# Patient Record
Sex: Male | Born: 2000 | Race: White | Hispanic: No | Marital: Single | Smoking: Never smoker
Health system: Southern US, Community
[De-identification: ages and names within clinical notes are randomized; demographics above are authoritative.]

## PROBLEM LIST (undated history)

## (undated) DIAGNOSIS — E079 Disorder of thyroid, unspecified: Secondary | ICD-10-CM

---
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
## 2019-09-13 ENCOUNTER — Other Ambulatory Visit: Payer: Self-pay

## 2019-09-13 DIAGNOSIS — Z20822 Contact with and (suspected) exposure to covid-19: Secondary | ICD-10-CM

## 2019-09-15 LAB — NOVEL CORONAVIRUS, NAA: SARS-CoV-2, NAA: NOT DETECTED

## 2021-04-22 DIAGNOSIS — E039 Hypothyroidism, unspecified: Secondary | ICD-10-CM | POA: Diagnosis not present

## 2021-05-14 DIAGNOSIS — E039 Hypothyroidism, unspecified: Secondary | ICD-10-CM | POA: Diagnosis not present

## 2021-07-15 DIAGNOSIS — R7989 Other specified abnormal findings of blood chemistry: Secondary | ICD-10-CM | POA: Diagnosis not present

## 2021-07-15 DIAGNOSIS — R946 Abnormal results of thyroid function studies: Secondary | ICD-10-CM | POA: Diagnosis not present

## 2021-09-26 DIAGNOSIS — E039 Hypothyroidism, unspecified: Secondary | ICD-10-CM | POA: Diagnosis not present

## 2021-12-01 ENCOUNTER — Encounter: Payer: Self-pay | Admitting: Emergency Medicine

## 2021-12-01 ENCOUNTER — Ambulatory Visit
Admission: EM | Admit: 2021-12-01 | Discharge: 2021-12-01 | Disposition: A | Discharge status: Home / Self Care | Payer: BC Managed Care – PPO

## 2021-12-01 ENCOUNTER — Ambulatory Visit (INDEPENDENT_AMBULATORY_CARE_PROVIDER_SITE_OTHER): Payer: BC Managed Care – PPO

## 2021-12-01 ENCOUNTER — Other Ambulatory Visit: Payer: Self-pay

## 2021-12-01 DIAGNOSIS — M7989 Other specified soft tissue disorders: Secondary | ICD-10-CM | POA: Diagnosis not present

## 2021-12-01 DIAGNOSIS — L03113 Cellulitis of right upper limb: Secondary | ICD-10-CM | POA: Diagnosis not present

## 2021-12-01 DIAGNOSIS — M79641 Pain in right hand: Secondary | ICD-10-CM

## 2021-12-01 HISTORY — DX: Disorder of thyroid, unspecified: E07.9

## 2021-12-01 MED ORDER — DOXYCYCLINE HYCLATE 100 MG PO CAPS: 100.0000 mg | ORAL_CAPSULE | Freq: Two times a day (BID) | ORAL | 0 refills | Status: DC

## 2021-12-01 MED ORDER — PREDNISONE 20 MG PO TABS: 40.0000 mg | ORAL_TABLET | Freq: Every day | ORAL | 0 refills | Status: DC

## 2021-12-01 MED ORDER — CEFTRIAXONE SODIUM 1 G IJ SOLR
1.0000 g | Freq: Once | INTRAMUSCULAR | Status: AC
  Administered 2021-12-01: 1 g via INTRAMUSCULAR

## 2021-12-01 NOTE — Discharge Instructions (Addendum)
Return to either urgent care in [place] or here in [place] just for recheck of your hand in 3 days.  As discussed if any of your symptoms worsen or if the redness in your hand starts to expand beyond the location that the redness is present now I would like for you to immediately to the emergency department as this indication that your infection is deeper within the hand and will require IV antibiotics.

## 2021-12-01 NOTE — ED Triage Notes (Signed)
Pt states he poked his right hand on a wore fence 5 days ago. He know has swelling and pain. Pt believes his last tetanus was 2 years ago.

## 2021-12-01 NOTE — ED Provider Notes (Signed)
David Hardy    CSN: [id number] Arrival date & time: 12/01/21  1114      History   Chief Complaint Chief Complaint  Patient presents with   Hand Pain    HPI David Hardy is a 21 y.o. male.   HPI Patient presents today with right hand swelling, redness, and pain following an injury in which he punctured his hand with a fence 5 days ago. He reports 4 days ago noticing mild redness, however swelling and redness progressively worsened over the last 24 hours. Patient is afebrile. Endorses that he is up to date with his tetanus  (rec'd within last 2 years).  Past Medical History:  Diagnosis Date   Thyroid disease     There are no problems to display for this patient.   History reviewed. No pertinent surgical history.     Home Medications    Prior to Admission medications   Medication Sig Start Date End Date Taking? Authorizing Provider  doxycycline (VIBRAMYCIN) 100 MG capsule Take 1 capsule (100 mg total) by mouth 2 (two) times daily. 12/01/21  Yes Bing Neighbors, FNP  predniSONE (DELTASONE) 20 MG tablet Take 2 tablets (40 mg total) by mouth daily with breakfast. 12/01/21  Yes Bing Neighbors, FNP  levothyroxine (SYNTHROID) 150 MCG tablet Take 150 mcg by mouth every morning. 08/15/21   [provider]    Family History History reviewed. No pertinent family history.  Social History Social History   Tobacco Use   Smoking status: Never   Smokeless tobacco: Never  Vaping Use   Vaping Use: Never used  Substance Use Topics   Alcohol use: Never   Drug use: Never     Allergies   Patient has no known allergies.   Review of Systems Review of Systems Pertinent negatives listed in HPI  Physical Exam Triage Vital Signs ED Triage Vitals  Enc Vitals Group     BP 12/01/21 1156 (!) 144/94     Pulse Rate 12/01/21 1156 86     Resp 12/01/21 1156 18     Temp 12/01/21 1156 98.6 F (37 C)     Temp Source 12/01/21 1156 Oral     SpO2 12/01/21  1156 98 %     Weight --      Height --      Head Circumference --      Peak Flow --      Pain Score 12/01/21 1155 8     Pain Loc --      Pain Edu? --      Excl. in GC? --    No data found.  Updated Vital Signs BP (!) 144/94 (BP Location: Left Arm)    Pulse 86    Temp 98.6 F (37 C) (Oral)    Resp 18    SpO2 98%   Visual Acuity Right Eye Distance:   Left Eye Distance:   Bilateral Distance:    Right Eye Near:   Left Eye Near:    Bilateral Near:     Physical Exam Constitutional:      Appearance: Normal appearance. He is not ill-appearing, toxic-appearing or diaphoretic.  HENT:     Head: Normocephalic and atraumatic.  Eyes:     Extraocular Movements: Extraocular movements intact.     Pupils: Pupils are equal, round, and reactive to light.  Cardiovascular:     Rate and Rhythm: Normal rate and regular rhythm.  Pulmonary:     Effort: Pulmonary effort is  normal.     Breath sounds: Normal breath sounds.  Skin:    Capillary Refill: Capillary refill takes less than 2 seconds.     Comments: See photos  Neurological:     General: No focal deficit present.     Mental Status: He is alert and oriented to person, place, and time.  Psychiatric:        Mood and Affect: Mood normal.        Behavior: Behavior normal.        Thought Content: Thought content normal.        Judgment: Judgment normal.           UC Treatments / Results  Labs (all labs ordered are listed, but only abnormal results are displayed) Labs Reviewed - No data to display  EKG   Radiology DG Hand Complete Right  Result Date: 12/01/2021 CLINICAL DATA:  Trauma pain and swelling EXAM: RIGHT HAND - COMPLETE 3+ VIEW COMPARISON:  None. FINDINGS: No fracture or dislocation is seen. No focal lytic lesions are seen. There is soft tissue swelling over the dorsum. There are no opaque foreign bodies. IMPRESSION: No fracture or dislocation is seen. There are no opaque foreign bodies. If there is clinical suspicion  for osteomyelitis, follow-up MRI may be considered. Electronically Signed   By: Ernie Avena M.D.   On: 12/01/2021 12:36    Procedures Procedures (including critical care time)  Medications Ordered in UC Medications  cefTRIAXone (ROCEPHIN) injection 1 g (has no administration in time range)    Initial Impression / Assessment and Plan / UC Course  I have reviewed the triage vital signs and the nursing notes.  Pertinent labs & imaging results that were available during my care of the patient were reviewed by me and considered in my medical decision making (see chart for details).    Cellulitis of Right Hand Rocephin 1 gm IM given in clinic Continue antibiotic treatment with doxycyline 100 mg BID Prednisone 40 mg daily x 5 days.  RTC in 3 days for recheck. Discussed at length indications in which patient should seek care in the ER and that in some circumstances these types of infection require IV antibiotics.  Final Clinical Impressions(s) / UC Diagnoses   Final diagnoses:  Cellulitis of hand, right     Discharge Instructions      Return to either urgent care in [place] or here in [place] just for recheck of your hand in 3 days.  As discussed if any of your symptoms worsen or if the redness in your hand starts to expand beyond the location that the redness is present now I would like for you to immediately to the emergency department as this indication that your infection is deeper within the hand and will require IV antibiotics.   ED Prescriptions     Medication Sig Dispense Auth. Provider   doxycycline (VIBRAMYCIN) 100 MG capsule Take 1 capsule (100 mg total) by mouth 2 (two) times daily. 20 capsule Bing Neighbors, FNP   predniSONE (DELTASONE) 20 MG tablet Take 2 tablets (40 mg total) by mouth daily with breakfast. 10 tablet Bing Neighbors, FNP      PDMP not reviewed this encounter.   Bing Neighbors, [place] 12/02/21 [phone]

## 2021-12-02 ENCOUNTER — Encounter (HOSPITAL_BASED_OUTPATIENT_CLINIC_OR_DEPARTMENT_OTHER)
Admission: RE | Disposition: A | Discharge status: Home / Self Care | Payer: Self-pay | Source: Ambulatory Visit | Attending: Orthopedic Surgery

## 2021-12-02 ENCOUNTER — Other Ambulatory Visit: Payer: Self-pay

## 2021-12-02 ENCOUNTER — Other Ambulatory Visit: Payer: Self-pay | Admitting: Orthopedic Surgery

## 2021-12-02 ENCOUNTER — Ambulatory Visit (HOSPITAL_BASED_OUTPATIENT_CLINIC_OR_DEPARTMENT_OTHER)
Admission: RE | Admit: 2021-12-02 | Discharge: 2021-12-02 | Disposition: A | Discharge status: Home / Self Care | Payer: BC Managed Care – PPO | Source: Ambulatory Visit | Attending: Orthopedic Surgery | Admitting: Orthopedic Surgery

## 2021-12-02 ENCOUNTER — Encounter (HOSPITAL_BASED_OUTPATIENT_CLINIC_OR_DEPARTMENT_OTHER): Payer: Self-pay | Admitting: Orthopedic Surgery

## 2021-12-02 ENCOUNTER — Ambulatory Visit (HOSPITAL_BASED_OUTPATIENT_CLINIC_OR_DEPARTMENT_OTHER): Payer: BC Managed Care – PPO | Admitting: Anesthesiology

## 2021-12-02 DIAGNOSIS — S61234A Puncture wound without foreign body of right ring finger without damage to nail, initial encounter: Secondary | ICD-10-CM | POA: Diagnosis not present

## 2021-12-02 DIAGNOSIS — L02511 Cutaneous abscess of right hand: Secondary | ICD-10-CM | POA: Insufficient documentation

## 2021-12-02 DIAGNOSIS — X58XXXA Exposure to other specified factors, initial encounter: Secondary | ICD-10-CM | POA: Insufficient documentation

## 2021-12-02 DIAGNOSIS — M65141 Other infective (teno)synovitis, right hand: Secondary | ICD-10-CM | POA: Diagnosis not present

## 2021-12-02 DIAGNOSIS — E039 Hypothyroidism, unspecified: Secondary | ICD-10-CM | POA: Insufficient documentation

## 2021-12-02 HISTORY — PX: INCISION AND DRAINAGE: SHX5863

## 2021-12-02 SURGERY — INCISION AND DRAINAGE
Anesthesia: Monitor Anesthesia Care | Site: Hand | Laterality: Right

## 2021-12-02 MED ORDER — FENTANYL CITRATE (PF) 100 MCG/2ML IJ SOLN
INTRAMUSCULAR | Status: AC
  Filled 2021-12-02: qty 2

## 2021-12-02 MED ORDER — LACTATED RINGERS IV SOLN: INTRAVENOUS | Status: DC | PRN

## 2021-12-02 MED ORDER — PROPOFOL 500 MG/50ML IV EMUL
INTRAVENOUS | Status: DC | PRN
  Administered 2021-12-02: 75 ug/kg/min via INTRAVENOUS

## 2021-12-02 MED ORDER — HYDROCODONE-ACETAMINOPHEN 5-325 MG PO TABS: ORAL_TABLET | ORAL | 0 refills | Status: DC

## 2021-12-02 MED ORDER — FENTANYL CITRATE (PF) 100 MCG/2ML IJ SOLN
INTRAMUSCULAR | Status: DC | PRN
Start: 2021-12-02 — End: 2021-12-02
  Administered 2021-12-02: 50 ug via INTRAVENOUS

## 2021-12-02 MED ORDER — PROPOFOL 500 MG/50ML IV EMUL
INTRAVENOUS | Status: AC
  Filled 2021-12-02: qty 50

## 2021-12-02 MED ORDER — MIDAZOLAM HCL 2 MG/2ML IJ SOLN
INTRAMUSCULAR | Status: AC
  Filled 2021-12-02: qty 2

## 2021-12-02 MED ORDER — ONDANSETRON HCL 4 MG/2ML IJ SOLN
INTRAMUSCULAR | Status: DC | PRN
Start: 2021-12-02 — End: 2021-12-02
  Administered 2021-12-02: 4 mg via INTRAVENOUS

## 2021-12-02 MED ORDER — 0.9 % SODIUM CHLORIDE (POUR BTL) OPTIME
TOPICAL | Status: DC | PRN
Start: 2021-12-02 — End: 2021-12-02
  Administered 2021-12-02: 200 mL

## 2021-12-02 MED ORDER — BUPIVACAINE-EPINEPHRINE (PF) 0.5% -1:200000 IJ SOLN
INTRAMUSCULAR | Status: DC | PRN
  Administered 2021-12-02: 30 mL via PERINEURAL

## 2021-12-02 MED ORDER — FENTANYL CITRATE (PF) 100 MCG/2ML IJ SOLN
100.0000 ug | Freq: Once | INTRAMUSCULAR | Status: AC
  Administered 2021-12-02: 100 ug via INTRAVENOUS

## 2021-12-02 MED ORDER — MIDAZOLAM HCL 2 MG/2ML IJ SOLN
2.0000 mg | Freq: Once | INTRAMUSCULAR | Status: AC
  Administered 2021-12-02: 2 mg via INTRAVENOUS

## 2021-12-02 MED ORDER — PROPOFOL 10 MG/ML IV BOLUS
INTRAVENOUS | Status: DC | PRN
  Administered 2021-12-02: 200 mg via INTRAVENOUS

## 2021-12-02 MED ORDER — CEFAZOLIN SODIUM-DEXTROSE 2-3 GM-%(50ML) IV SOLR
INTRAVENOUS | Status: DC | PRN
  Administered 2021-12-02: 2 g via INTRAVENOUS

## 2021-12-02 MED ORDER — DEXAMETHASONE SODIUM PHOSPHATE 10 MG/ML IJ SOLN
INTRAMUSCULAR | Status: DC | PRN
  Administered 2021-12-02: 5 mg

## 2021-12-02 SURGICAL SUPPLY — 52 items
APL PRP STRL LF DISP 70% ISPRP (MISCELLANEOUS) ×1
BAG DECANTER FOR FLEXI CONT (MISCELLANEOUS) IMPLANT
BLADE MINI RND TIP GREEN BEAV (BLADE) IMPLANT
BLADE SURG 15 STRL LF DISP TIS (BLADE) ×2 IMPLANT
BLADE SURG 15 STRL SS (BLADE) ×4
BNDG CMPR 9X4 STRL LF SNTH (GAUZE/BANDAGES/DRESSINGS) ×1
BNDG COHESIVE 1X5 TAN STRL LF (GAUZE/BANDAGES/DRESSINGS) IMPLANT
BNDG ELASTIC 2X5.8 VLCR STR LF (GAUZE/BANDAGES/DRESSINGS) IMPLANT
BNDG ELASTIC 3X5.8 VLCR STR LF (GAUZE/BANDAGES/DRESSINGS) ×1 IMPLANT
BNDG ESMARK 4X9 LF (GAUZE/BANDAGES/DRESSINGS) ×1 IMPLANT
BNDG GAUZE 1X2.1 STRL (MISCELLANEOUS) IMPLANT
BNDG GAUZE ELAST 4 BULKY (GAUZE/BANDAGES/DRESSINGS) ×1 IMPLANT
CHLORAPREP W/TINT 26 (MISCELLANEOUS) ×2 IMPLANT
CORD BIPOLAR FORCEPS 12FT (ELECTRODE) ×2 IMPLANT
COVER BACK TABLE 60X90IN (DRAPES) ×2 IMPLANT
COVER MAYO STAND STRL (DRAPES) ×2 IMPLANT
CUFF TOURN SGL QUICK 18X4 (TOURNIQUET CUFF) ×2 IMPLANT
DRAPE EXTREMITY T 121X128X90 (DISPOSABLE) ×2 IMPLANT
DRAPE SURG 17X23 STRL (DRAPES) ×1 IMPLANT
GAUZE PACKING IODOFORM 1/4X15 (PACKING) ×1 IMPLANT
GAUZE SPONGE 4X4 12PLY STRL (GAUZE/BANDAGES/DRESSINGS) ×2 IMPLANT
GAUZE XEROFORM 1X8 LF (GAUZE/BANDAGES/DRESSINGS) ×2 IMPLANT
GLOVE SRG 8 PF TXTR STRL LF DI (GLOVE) ×1 IMPLANT
GLOVE SURG ENC MOIS LTX SZ7.5 (GLOVE) ×2 IMPLANT
GLOVE SURG UNDER POLY LF SZ8 (GLOVE) ×2
GOWN STRL REUS W/ TWL LRG LVL3 (GOWN DISPOSABLE) ×1 IMPLANT
GOWN STRL REUS W/TWL LRG LVL3 (GOWN DISPOSABLE) ×2
GOWN STRL REUS W/TWL XL LVL3 (GOWN DISPOSABLE) ×2 IMPLANT
LOOP VESSEL MAXI BLUE (MISCELLANEOUS) IMPLANT
NDL BLUNT 17GA (NEEDLE) IMPLANT
NDL HYPO 25X1 1.5 SAFETY (NEEDLE) IMPLANT
NEEDLE BLUNT 17GA (NEEDLE) IMPLANT
NEEDLE HYPO 25X1 1.5 SAFETY (NEEDLE) IMPLANT
NS IRRIG 1000ML POUR BTL (IV SOLUTION) ×2 IMPLANT
PACK BASIN DAY SURGERY FS (CUSTOM PROCEDURE TRAY) ×2 IMPLANT
PAD CAST 3X4 CTTN HI CHSV (CAST SUPPLIES) IMPLANT
PADDING CAST ABS 4INX4YD NS (CAST SUPPLIES) ×1
PADDING CAST ABS COTTON 4X4 ST (CAST SUPPLIES) ×1 IMPLANT
PADDING CAST COTTON 3X4 STRL (CAST SUPPLIES)
SPLINT PLASTER CAST XFAST 3X15 (CAST SUPPLIES) IMPLANT
SPLINT PLASTER XTRA FASTSET 3X (CAST SUPPLIES) ×10
STOCKINETTE 4X48 STRL (DRAPES) ×2 IMPLANT
SUT ETHILON 4 0 PS 2 18 (SUTURE) IMPLANT
SWAB COLLECTION DEVICE MRSA (MISCELLANEOUS) ×1 IMPLANT
SWAB CULTURE ESWAB REG 1ML (MISCELLANEOUS) ×1 IMPLANT
SYR 20ML LL LF (SYRINGE) IMPLANT
SYR BULB EAR ULCER 3OZ GRN STR (SYRINGE) ×2 IMPLANT
SYR CONTROL 10ML LL (SYRINGE) IMPLANT
SYR TOOMEY 50ML (SYRINGE) IMPLANT
TOWEL GREEN STERILE FF (TOWEL DISPOSABLE) ×4 IMPLANT
TUBE FEEDING ENTERAL 5FR 16IN (TUBING) IMPLANT
UNDERPAD 30X36 HEAVY ABSORB (UNDERPADS AND DIAPERS) ×2 IMPLANT

## 2021-12-02 NOTE — Anesthesia Preprocedure Evaluation (Addendum)
Anesthesia Evaluation  Patient identified by MRN, date of birth, ID band Patient awake    Reviewed: Allergy & Precautions, NPO status , Patient's Chart, lab work & pertinent test results  History of Anesthesia Complications Negative for: history of anesthetic complications  Airway Mallampati: III  TM Distance: >3 FB Neck ROM: Full    Dental no notable dental hx. (+) Dental Advisory Given   Pulmonary neg pulmonary ROS,    Pulmonary exam normal        Cardiovascular negative cardio ROS   Rhythm:Regular Rate:Tachycardia     Neuro/Psych negative neurological ROS     GI/Hepatic negative GI ROS, Neg liver ROS,   Endo/Other  Hypothyroidism   Renal/GU negative Renal ROS     Musculoskeletal negative musculoskeletal ROS (+)   Abdominal   Peds  Hematology negative hematology ROS (+)   Anesthesia Other Findings   Reproductive/Obstetrics                            Anesthesia Physical Anesthesia Plan  ASA: 2  Anesthesia Plan: Regional and General   Post-op Pain Management: Celebrex PO (pre-op) and Tylenol PO (pre-op)   Induction:   PONV Risk Score and Plan: 2 and Ondansetron and Midazolam  Airway Management Planned: LMA  Additional Equipment:   Intra-op Plan:   Post-operative Plan: Extubation in OR  Informed Consent: I have reviewed the patients History and Physical, chart, labs and discussed the procedure including the risks, benefits and alternatives for the proposed anesthesia with the patient or authorized representative who has indicated his/her understanding and acceptance.     Dental advisory given  Plan Discussed with: CRNA and Anesthesiologist  Anesthesia Plan Comments:       Anesthesia Quick Evaluation

## 2021-12-02 NOTE — Anesthesia Procedure Notes (Signed)
Procedure Name: MAC Date/Time: 12/02/2021 3:34 PM Performed by: Signe Colt, CRNA Pre-anesthesia Checklist: Patient identified Patient Re-evaluated:Patient Re-evaluated prior to induction Oxygen Delivery Method: Simple face mask

## 2021-12-02 NOTE — Progress Notes (Signed)
Assisted Dr. Singer with right, ultrasound guided, supraclavicular block. Side rails up, monitors on throughout procedure. See vital signs in flow sheet. Tolerated Procedure well. 

## 2021-12-02 NOTE — H&P (Signed)
°  David Hardy is an 21 y.o. male.   Chief Complaint: Right ring finger infection HPI: 21 year old right-hand-dominant male states he sustained a puncture wound to the volar aspect of the right ring finger 5 days ago.  This has become progressively more swollen painful and erythematous.  No fevers chills or night sweats.  He was seen at urgent care and referred for further care.  He reports no previous injury to the finger.  Allergies: No Known Allergies  Past Medical History:  Diagnosis Date   Thyroid disease     History reviewed. No pertinent surgical history.  Family History: History reviewed. No pertinent family history.  Social History:   reports that he has never smoked. He has never used smokeless tobacco. He reports that he does not drink alcohol and does not use drugs.  Medications: Medications Prior to Admission  Medication Sig Dispense Refill   doxycycline (VIBRAMYCIN) 100 MG capsule Take 1 capsule (100 mg total) by mouth 2 (two) times daily. 20 capsule 0   levothyroxine (SYNTHROID) 150 MCG tablet Take 150 mcg by mouth every morning.     predniSONE (DELTASONE) 20 MG tablet Take 2 tablets (40 mg total) by mouth daily with breakfast. 10 tablet 0    No results found for this or any previous visit (from the past 48 hour(s)).  DG Hand Complete Right  Result Date: 12/01/2021 CLINICAL DATA:  Trauma pain and swelling EXAM: RIGHT HAND - COMPLETE 3+ VIEW COMPARISON:  None. FINDINGS: No fracture or dislocation is seen. No focal lytic lesions are seen. There is soft tissue swelling over the dorsum. There are no opaque foreign bodies. IMPRESSION: No fracture or dislocation is seen. There are no opaque foreign bodies. If there is clinical suspicion for osteomyelitis, follow-up MRI may be considered. Electronically Signed   By: Ernie Avena M.D.   On: 12/01/2021 12:36      Blood pressure (!) 157/95, pulse (!) 129, temperature 98.4 F (36.9 C), temperature source Oral, resp.  rate 16, height 5\' 7"  (1.702 m), weight 98.7 kg, SpO2 100 %.  General appearance: alert, cooperative, and appears stated age Head: Normocephalic, without obvious abnormality, atraumatic Neck: supple, symmetrical, trachea midline Extremities: Intact sensation and capillary refill all digits.  +epl/fpl/io.  Right ring finger with puncture wound at proximal finger flexion crease.  There is surrounding erythema and swelling.  He is tender to palpation in this area.  Nontender over the middle phalanx volarly.  Pain at the volar aspect of the MP joint with passive extension of the finger.  He is able to flex. Pulses: 2+ and symmetric Skin: Skin color, texture, turgor normal. No rashes or lesions Neurologic: Grossly normal Incision/Wound: As above  Assessment/Plan Right ring finger abscess possible flexor sheath infection.  Recommend incision and drainage in the operating room possibly including the flexor tendon sheath.  Risks, benefits and alternatives of surgery were discussed including risks of blood loss, infection, damage to nerves/vessels/tendons/ligament/bone, failure of surgery, need for additional surgery, complication with wound healing, stiffness, need for repeat irrigation and debridement.  He voiced understanding of these risks and elected to proceed.    12/02/2021, 3:04 PM

## 2021-12-02 NOTE — Op Note (Signed)
NAME: David Hardy [facility] MEDICAL RECORD NO: [id number] DATE OF BIRTH: [birth year] FACILITY: [facility] LOCATION: [facility] PHYSICIAN: Tami Ribas, MD   OPERATIVE REPORT   DATE OF PROCEDURE: 12/02/21    PREOPERATIVE DIAGNOSIS: Right hand and ring finger abscess with possible flexor sheath infection   POSTOPERATIVE DIAGNOSIS: Right hand and ring finger abscess   PROCEDURE: Vision drainage right hand and ring finger abscess   SURGEON:  Betha Loa, M.D.   ASSISTANT: none   ANESTHESIA:  General with regional   INTRAVENOUS FLUIDS:  Per anesthesia flow sheet.   ESTIMATED BLOOD LOSS:  Minimal.   COMPLICATIONS:  None.   SPECIMENS: Cultures to micro   TOURNIQUET TIME:    Total Tourniquet Time Documented: Upper Arm (Right) - 18 minutes Total: Upper Arm (Right) - 18 minutes    DISPOSITION:  Stable to PACU.   INDICATIONS: 21 year old male states he poked the base of his right ring finger on a fence post 5 days ago.  He has had progressively worsening swelling and erythema as well as pain in the area.  He was seen by his primary care physician this morning and referred for further care.  I recommend incision and drainage of the right hand and ring finger possibly include risks, benefits and alternatives of surgery were discussed including the risks of blood loss, infection, damage to nerves, vessels, tendons, ligaments, bone for surgery, need for additional surgery, complications with wound healing, continued pain, stiffness, , need for repeat irrigation and debridement.  He voiced understanding of these risks and elected to proceed.  OPERATIVE COURSE:  After being identified preoperatively by myself,  the patient and I agreed on the procedure and site of the procedure.  The surgical site was marked.  Surgical consent had been signed. He was given IV antibiotics as preoperative antibiotic prophylaxis. He was transferred to the operating room and placed on the operating table  in supine position with the Right upper extremity on an arm board.  A regional block had been performed by anesthesia in preoperative holding.    Right upper extremity was prepped and draped in normal sterile orthopedic fashion.  A surgical pause was performed between the surgeons, anesthesia, and operating room staff and all were in agreement as to the patient, procedure, and site of procedure.  Tourniquet at the proximal aspect of the extremity was inflated to 250 mmHg after exsanguination of the arm with an Esmarch bandage.  Incision was made at the volar aspect of the MP joint of the ring finger.  The abscess appeared to be right at the proximal finger flexion crease.  He was converted to general anesthesia at this point.  The subcutaneous tissues were spread with the scissors.  There is gross purulence.  Cultures were taken for aerobes and anaerobes.  The skin was removed over the abscess at the proximal flexion crease.  There was a tract going down to the abscess cavity.  The purulence was removed with the Ray-Tec sponge.  The flexor tendon sheath was visualized.  There was no violation of the sheath.  There did not appear to be fluid within the sheath and the sheath itself was not distended.  The wound was copiously irrigated with sterile saline.  The pickups were used to debride any devitalized fat and purulence.  A Ray-Tec sponge was also used to help remove purulence.  After copious irrigation with sterile saline the wound was packed with quarter inch iodoform gauze.  The wounds were then  dressed with sterile 4 x 4's and wrapped with a Kerlix bandage.  A volar splint was placed including the long ring and small fingers.  This was wrapped with Kerlix and Ace bandage.  The tourniquet was deflated at 18 minutes.  Fingertips were pink with brisk capillary refill after deflation of tourniquet.  The operative  drapes were broken down.  The patient was awoken from anesthesia safely.  He was transferred back to  the stretcher and taken to PACU in stable condition.  I will see him back in the office in 3-4 days for postoperative followup.  I will give him a prescription for Norco 5/325 1-2 tabs PO q6 hours prn pain, dispense # 20 and doxycycline 100 mg p.o. twice daily x7 days.   Betha Loa, MD Electronically signed, 12/02/21

## 2021-12-02 NOTE — Anesthesia Postprocedure Evaluation (Signed)
Anesthesia Post Note  Patient: David Hardy  Procedure(s) Performed: INCISION AND DRAINAGE RIGHT RING FINGER (Right: Hand)     Patient location during evaluation: PACU Anesthesia Type: Regional and General Level of consciousness: sedated Pain management: pain level controlled Vital Signs Assessment: post-procedure vital signs reviewed and stable Respiratory status: spontaneous breathing and respiratory function stable Cardiovascular status: stable Postop Assessment: no apparent nausea or vomiting Anesthetic complications: no   No notable events documented.  Last Vitals:  Vitals:   12/02/21 1630 12/02/21 1645  BP: (!) 117/93 128/90  Pulse: (!) 109 100  Resp: (!) 25 16  Temp:  36.8 C  SpO2: 92% 95%    Last Pain:  Vitals:   12/02/21 1645  TempSrc:   PainSc: 0-No pain                 [name] DANIEL

## 2021-12-02 NOTE — Transfer of Care (Signed)
Immediate Anesthesia Transfer of Care Note  Patient: Bill Yohn Foulkes  Procedure(s) Performed: INCISION AND DRAINAGE RIGHT RING FINGER (Right: Hand)  Patient Location: PACU  Anesthesia Type:GA combined with regional for post-op pain  Level of Consciousness: drowsy and patient cooperative  Airway & Oxygen Therapy: Patient Spontanous Breathing and Patient connected to face mask oxygen  Post-op Assessment: Report given to RN and Post -op Vital signs reviewed and stable  Post vital signs: Reviewed and stable  Last Vitals:  Vitals Value Taken Time  BP    Temp    Pulse 91 12/02/21 1602  Resp    SpO2 98 % 12/02/21 1602  Vitals shown include unvalidated device data.  Last Pain:  Vitals:   12/02/21 1449  TempSrc: Oral  PainSc: 7       Patients Stated Pain Goal: 3 (12/02/21 1449)  Complications: No notable events documented.

## 2021-12-02 NOTE — Anesthesia Procedure Notes (Signed)
Anesthesia Regional Block: Supraclavicular block   Pre-Anesthetic Checklist: , timeout performed,  Correct Patient, Correct Site, Correct Laterality,  Correct Procedure, Correct Position, site marked,  Risks and benefits discussed,  Surgical consent,  Pre-op evaluation,  At surgeon's request and post-op pain management  Laterality: Right  Prep: chloraprep       Needles:  Injection technique: Single-shot  Needle Type: Echogenic Stimulator Needle     Needle Length: 5cm  Needle Gauge: 22     Additional Needles:   Narrative:  Injection made incrementally with aspirations every 5 mL.  Performed by: Personally  Anesthesiologist: Heather Roberts, MD  Additional Notes: Functioning IV was confirmed and monitors applied.  A 80mm 22ga echogenic arrow stimulator was used. Sterile prep and drape,hand hygiene and sterile gloves were used.Ultrasound guidance: relevant anatomy identified, needle position confirmed, local anesthetic spread visualized around nerve(s)., vascular puncture avoided.  Image printed for medical record.  Negative aspiration and negative test dose prior to incremental administration of local anesthetic. The patient tolerated the procedure well.

## 2021-12-02 NOTE — Discharge Instructions (Addendum)

## 2021-12-02 NOTE — Anesthesia Procedure Notes (Signed)
Procedure Name: LMA Insertion Date/Time: 12/02/2021 3:35 PM Performed by: Sheryn Bison, CRNA Pre-anesthesia Checklist: Patient identified, Emergency Drugs available, Suction available and Patient being monitored Patient Re-evaluated:Patient Re-evaluated prior to induction Oxygen Delivery Method: Circle System Utilized Preoxygenation: Pre-oxygenation with 100% oxygen Induction Type: IV induction Ventilation: Mask ventilation without difficulty LMA: LMA inserted LMA Size: 4.0 Number of attempts: 1 Airway Equipment and Method: bite block Placement Confirmation: positive ETCO2 Tube secured with: Tape Dental Injury: Teeth and Oropharynx as per pre-operative assessment

## 2021-12-03 ENCOUNTER — Encounter (HOSPITAL_BASED_OUTPATIENT_CLINIC_OR_DEPARTMENT_OTHER): Payer: Self-pay | Admitting: Orthopedic Surgery

## 2021-12-04 DIAGNOSIS — M25641 Stiffness of right hand, not elsewhere classified: Secondary | ICD-10-CM | POA: Diagnosis not present

## 2021-12-04 DIAGNOSIS — M6510 Other infective (teno)synovitis, unspecified site: Secondary | ICD-10-CM | POA: Diagnosis not present

## 2021-12-04 DIAGNOSIS — R52 Pain, unspecified: Secondary | ICD-10-CM | POA: Diagnosis not present

## 2021-12-06 DIAGNOSIS — M25641 Stiffness of right hand, not elsewhere classified: Secondary | ICD-10-CM | POA: Diagnosis not present

## 2021-12-06 DIAGNOSIS — R52 Pain, unspecified: Secondary | ICD-10-CM | POA: Diagnosis not present

## 2021-12-06 DIAGNOSIS — M6510 Other infective (teno)synovitis, unspecified site: Secondary | ICD-10-CM | POA: Diagnosis not present

## 2021-12-07 LAB — AEROBIC/ANAEROBIC CULTURE W GRAM STAIN (SURGICAL/DEEP WOUND)

## 2021-12-09 DIAGNOSIS — R52 Pain, unspecified: Secondary | ICD-10-CM | POA: Diagnosis not present

## 2021-12-09 DIAGNOSIS — M6510 Other infective (teno)synovitis, unspecified site: Secondary | ICD-10-CM | POA: Diagnosis not present

## 2021-12-09 DIAGNOSIS — M25641 Stiffness of right hand, not elsewhere classified: Secondary | ICD-10-CM | POA: Diagnosis not present

## 2021-12-12 DIAGNOSIS — M25641 Stiffness of right hand, not elsewhere classified: Secondary | ICD-10-CM | POA: Diagnosis not present

## 2021-12-12 DIAGNOSIS — M6510 Other infective (teno)synovitis, unspecified site: Secondary | ICD-10-CM | POA: Diagnosis not present

## 2021-12-12 DIAGNOSIS — R52 Pain, unspecified: Secondary | ICD-10-CM | POA: Diagnosis not present

## 2021-12-16 DIAGNOSIS — R52 Pain, unspecified: Secondary | ICD-10-CM | POA: Diagnosis not present

## 2021-12-16 DIAGNOSIS — T148XXA Other injury of unspecified body region, initial encounter: Secondary | ICD-10-CM | POA: Diagnosis not present

## 2021-12-16 DIAGNOSIS — M25641 Stiffness of right hand, not elsewhere classified: Secondary | ICD-10-CM | POA: Diagnosis not present

## 2021-12-16 DIAGNOSIS — M6510 Other infective (teno)synovitis, unspecified site: Secondary | ICD-10-CM | POA: Diagnosis not present

## 2021-12-18 DIAGNOSIS — R52 Pain, unspecified: Secondary | ICD-10-CM | POA: Diagnosis not present

## 2021-12-18 DIAGNOSIS — M25641 Stiffness of right hand, not elsewhere classified: Secondary | ICD-10-CM | POA: Diagnosis not present

## 2021-12-18 DIAGNOSIS — M6510 Other infective (teno)synovitis, unspecified site: Secondary | ICD-10-CM | POA: Diagnosis not present

## 2021-12-18 DIAGNOSIS — T148XXA Other injury of unspecified body region, initial encounter: Secondary | ICD-10-CM | POA: Diagnosis not present

## 2021-12-26 DIAGNOSIS — E039 Hypothyroidism, unspecified: Secondary | ICD-10-CM | POA: Diagnosis not present

## 2022-04-03 ENCOUNTER — Ambulatory Visit (HOSPITAL_COMMUNITY)
Admission: EM | Admit: 2022-04-03 | Discharge: 2022-04-03 | Disposition: A | Discharge status: Home / Self Care | Payer: BC Managed Care – PPO | Attending: Emergency Medicine | Admitting: Emergency Medicine

## 2022-04-03 ENCOUNTER — Encounter (HOSPITAL_COMMUNITY): Payer: Self-pay | Admitting: Emergency Medicine

## 2022-04-03 DIAGNOSIS — H1031 Unspecified acute conjunctivitis, right eye: Secondary | ICD-10-CM | POA: Diagnosis not present

## 2022-04-03 MED ORDER — TETRACAINE HCL 0.5 % OP SOLN
OPHTHALMIC | Status: AC
  Filled 2022-04-03: qty 4

## 2022-04-03 MED ORDER — ERYTHROMYCIN 5 MG/GM OP OINT: TOPICAL_OINTMENT | Freq: Two times a day (BID) | OPHTHALMIC | 0 refills | Status: AC

## 2022-04-03 NOTE — ED Triage Notes (Signed)
Pt reports swelling, redness and drainage for a couple days to right eye.

## 2022-04-03 NOTE — Discharge Instructions (Signed)
Please use ointment as prescribed.   If symptoms do not improve, return to the urgent care. Go to the emergency department if symptoms worsen.

## 2022-04-03 NOTE — ED Provider Notes (Signed)
[facility]    CSN: [id number] Arrival date & time: 04/03/22  1904     History   Chief Complaint Chief Complaint  Patient presents with   Eye Problem    Swelling - Entered by patient    HPI David Hardy is a 21 y.o. male.  Presents with right eye redness, swelling, drainage for the past 3 days.  He has had no pain or irritation to the area.  Woke up one morning and had purulent drainage.  Does not wear contacts.  Denies changes in vision or blurry vision.  No foreign body sensation.  No pain with eye movements.  Has been around children who are sick.  Denies fever/chills, headache, congestion, cough, sore throat, chest pain, shortness of breath, abdominal pain, vomiting/diarrhea, rash, weakness, numbness/tingling.   Past Medical History:  Diagnosis Date   Thyroid disease     There are no problems to display for this patient.   Past Surgical History:  Procedure Laterality Date   INCISION AND DRAINAGE Right 12/02/2021   Procedure: INCISION AND DRAINAGE RIGHT RING FINGER;  Surgeon: Betha Loa, MD;  Location: [facility];  Service: Orthopedics;  Laterality: Right;       Home Medications    Prior to Admission medications   Medication Sig Start Date End Date Taking? Authorizing Provider  erythromycin ophthalmic ointment Place into both eyes in the morning and at bedtime for 5 days. Place a 1/2 inch ribbon of ointment into the lower eyelid. 04/03/22 04/08/22 Yes Tashawna Thom, Lurena Joiner, PA-C  levothyroxine (SYNTHROID) 150 MCG tablet Take 150 mcg by mouth every morning. 08/15/21   [provider]    Family History No family history on file.  Social History Social History   Tobacco Use   Smoking status: Never   Smokeless tobacco: Never  Vaping Use   Vaping Use: Never used  Substance Use Topics   Alcohol use: Never   Drug use: Never     Allergies   Patient has no known allergies.   Review of Systems Review of Systems Per  HPI  Physical Exam Triage Vital Signs ED Triage Vitals  Enc Vitals Group     BP 04/03/22 1916 (!) 143/84     Pulse Rate 04/03/22 1916 78     Resp 04/03/22 1916 17     Temp 04/03/22 1916 98.3 F (36.8 C)     Temp Source 04/03/22 1916 Oral     SpO2 04/03/22 1916 100 %     Weight --      Height --      Head Circumference --      Peak Flow --      Pain Score 04/03/22 1915 0     Pain Loc --      Pain Edu? --      Excl. in GC? --    No data found.  Updated Vital Signs BP (!) 143/84 (BP Location: Right Arm)   Pulse 78   Temp 98.3 F (36.8 C) (Oral)   Resp 17   SpO2 100%     Physical Exam Vitals and nursing note reviewed.  Constitutional:      General: He is not in acute distress. HENT:     Head: Normocephalic and atraumatic.     Right Ear: Tympanic membrane, ear canal and external ear normal.     Left Ear: Tympanic membrane, ear canal and external ear normal.     Nose: Nose normal. No congestion.  Mouth/Throat:     Mouth: Mucous membranes are moist.     Pharynx: Oropharynx is clear. No oropharyngeal exudate or posterior oropharyngeal erythema.  Eyes:     General:        Right eye: Discharge present.     Extraocular Movements: Extraocular movements intact.     Pupils: Pupils are equal, round, and reactive to light.     Comments: No pain with EOM, purulent discharge from right eye, erythema of the lower lid.  No orbital tenderness  Cardiovascular:     Rate and Rhythm: Normal rate and regular rhythm.     Heart sounds: Normal heart sounds.  Pulmonary:     Effort: Pulmonary effort is normal.     Breath sounds: Normal breath sounds.  Musculoskeletal:        General: Normal range of motion.     Cervical back: Normal range of motion.  Lymphadenopathy:     Cervical: No cervical adenopathy.  Skin:    General: Skin is warm and dry.  Neurological:     General: No focal deficit present.     Mental Status: He is alert and oriented to person, place, and time.      UC Treatments / Results  Labs (all labs ordered are listed, but only abnormal results are displayed) Labs Reviewed - No data to display  EKG  Radiology No results found.  Procedures Procedures (including critical care time)  Medications Ordered in UC Medications - No data to display  Initial Impression / Assessment and Plan / UC Course  I have reviewed the triage vital signs and the nursing notes.  Pertinent labs & imaging results that were available during my care of the patient were reviewed by me and considered in my medical decision making (see chart for details).  Fluorescein stain and Woods lamp evaluation reveals no ulceration, laceration, abrasion to the cornea.  Patient has no pain in the eye, EOM intact with no pain.  At this time I think he has bacterial conjunctivitis.  We discussed using antibiotic ointment twice a day for the next 5 days.  He will use in both eyes.  He understands to return to the urgent care if he feels his symptoms are not improved after the medicine.  He will go to the emergency department if symptoms worsening. Return precautions discussed. Patient agrees to plan and is discharged in stable condition.  Final Clinical Impressions(s) / UC Diagnoses   Final diagnoses:  Acute bacterial conjunctivitis of right eye     Discharge Instructions      Please use ointment as prescribed.   If symptoms do not improve, return to the urgent care. Go to the emergency department if symptoms worsen.    ED Prescriptions     Medication Sig Dispense Auth. Provider   erythromycin ophthalmic ointment Place into both eyes in the morning and at bedtime for 5 days. Place a 1/2 inch ribbon of ointment into the lower eyelid. 1 g Ardelle Haliburton, Lurena Joiner, PA-C      PDMP not reviewed this encounter.   Zala Degrasse, Lurena Joiner, [place] 04/03/22 2023

## 2022-10-14 DIAGNOSIS — Z0000 Encounter for general adult medical examination without abnormal findings: Secondary | ICD-10-CM | POA: Diagnosis not present

## 2022-10-14 DIAGNOSIS — Z23 Encounter for immunization: Secondary | ICD-10-CM | POA: Diagnosis not present

## 2022-10-14 DIAGNOSIS — E039 Hypothyroidism, unspecified: Secondary | ICD-10-CM | POA: Diagnosis not present

## 2022-10-14 DIAGNOSIS — Z13220 Encounter for screening for lipoid disorders: Secondary | ICD-10-CM | POA: Diagnosis not present

## 2022-12-16 DIAGNOSIS — E039 Hypothyroidism, unspecified: Secondary | ICD-10-CM | POA: Diagnosis not present

## 2023-11-04 DIAGNOSIS — Z23 Encounter for immunization: Secondary | ICD-10-CM | POA: Diagnosis not present

## 2023-11-04 DIAGNOSIS — Z0000 Encounter for general adult medical examination without abnormal findings: Secondary | ICD-10-CM | POA: Diagnosis not present

## 2023-11-04 DIAGNOSIS — Z13220 Encounter for screening for lipoid disorders: Secondary | ICD-10-CM | POA: Diagnosis not present

## 2023-11-04 DIAGNOSIS — E039 Hypothyroidism, unspecified: Secondary | ICD-10-CM | POA: Diagnosis not present

## 2023-12-18 DIAGNOSIS — E039 Hypothyroidism, unspecified: Secondary | ICD-10-CM | POA: Diagnosis not present
